# Patient Record
Sex: Female | Born: 1990 | Race: White | Hispanic: No | Marital: Single | Smoking: Never smoker
Health system: Southern US, Community
[De-identification: ages and names within clinical notes are randomized; demographics above are authoritative.]

---

## 2015-02-27 ENCOUNTER — Emergency Department (HOSPITAL_BASED_OUTPATIENT_CLINIC_OR_DEPARTMENT_OTHER): Payer: 59

## 2015-02-27 ENCOUNTER — Emergency Department (HOSPITAL_BASED_OUTPATIENT_CLINIC_OR_DEPARTMENT_OTHER)
Admission: EM | Admit: 2015-02-27 | Discharge: 2015-02-27 | Disposition: A | Discharge status: Home / Self Care | Payer: 59 | Attending: Emergency Medicine | Admitting: Emergency Medicine

## 2015-02-27 ENCOUNTER — Encounter (HOSPITAL_BASED_OUTPATIENT_CLINIC_OR_DEPARTMENT_OTHER): Payer: Self-pay

## 2015-02-27 DIAGNOSIS — S82832A Other fracture of upper and lower end of left fibula, initial encounter for closed fracture: Secondary | ICD-10-CM | POA: Insufficient documentation

## 2015-02-27 DIAGNOSIS — Y998 Other external cause status: Secondary | ICD-10-CM | POA: Diagnosis not present

## 2015-02-27 DIAGNOSIS — Y9389 Activity, other specified: Secondary | ICD-10-CM | POA: Insufficient documentation

## 2015-02-27 DIAGNOSIS — S99912A Unspecified injury of left ankle, initial encounter: Secondary | ICD-10-CM | POA: Diagnosis present

## 2015-02-27 DIAGNOSIS — Y9289 Other specified places as the place of occurrence of the external cause: Secondary | ICD-10-CM | POA: Insufficient documentation

## 2015-02-27 DIAGNOSIS — S9002XA Contusion of left ankle, initial encounter: Secondary | ICD-10-CM | POA: Diagnosis not present

## 2015-02-27 DIAGNOSIS — W1839XA Other fall on same level, initial encounter: Secondary | ICD-10-CM | POA: Diagnosis not present

## 2015-02-27 NOTE — Discharge Instructions (Signed)
Fibular Fracture, Ankle, Adult, Treated With or Without Immobilization °A fibular fracture at your ankle is a break (fracture) bone in the smallest of the two bones in your lower leg, located on the outside of your leg (fibula) close to the area at your ankle joint. °CAUSES °· Rolling your ankle. °· Twisting your ankle. °· Extreme flexing or extending of your foot. °· Severe force on your ankle as when falling from a distance. °RISK FACTORS °· Jumping activities. °· Participation in sports. °· Osteoporosis. °· Advanced age. °· Previous ankle injuries. °SIGNS AND SYMPTOMS °· Pain. °· Swelling. °· Inability to put weight on injured ankle. °· Bruising. °· Bone deformities at site of injury. °DIAGNOSIS  °This fracture is diagnosed with the help of an X-ray exam. °TREATMENT  °If the fractured bone did not move out of place it usually will heal without problems and does casting or splinting. If immobilization is needed for comfort or the fractured bone moved out of place and will not heal properly with immobilization, a cast or splint will be used. °HOME CARE INSTRUCTIONS  °· Apply ice to the area of injury: °¨ Put ice in a plastic bag. °¨ Place a towel between your skin and the bag. °¨ Leave the ice on for 20 minutes, 2-3 times a day. °· Use crutches as directed. Resume walking without crutches as directed by your health care provider. °· Only take over-the-counter or prescription medicines for pain, discomfort, or fever as directed by your health care provider. °· If you have a removable splint or boot, do not remove the boot unless directed by your health care provider. °SEEK MEDICAL CARE IF:  °· You have continued pain or more swelling °· The medications do not control the pain. °SEEK IMMEDIATE MEDICAL CARE IF: °· You develop severe pain in the leg or foot. °· Your skin or nails below the injury turn blue or grey or feel cold or numb. °MAKE SURE YOU:  °· Understand these instructions. °· Will watch your  condition. °· Will get help right away if you are not doing well or get worse. °Document Released: 06/30/2005 Document Revised: 04/20/2013 Document Reviewed: 02/09/2013 °[employer]® Patient Information ©2015 [employer]. This information is not intended to replace advice given to you by your health care provider. Make sure you discuss any questions you have with your health care provider. ° °

## 2015-02-27 NOTE — ED Provider Notes (Signed)
CSN: [id number]     Arrival date & time 02/27/15  2116 History  This chart was scribed for Benjiman Core, MD by Chestine Spore, ED Scribe. The patient was seen in room MH08/MH08 at 10:22 PM.     Chief Complaint  Patient presents with  . Ankle Pain      The history is provided by the patient. No language interpreter was used.    Tippi Mccrae is a 24 y.o. female who presents to the Emergency Department complaining of left ankle pain onset tonight PTA. Pt reports that she was playing kickball when she fell and injured herself. Pt notes that she is able to ambulate but not without pain to the ankle so she has been avoiding walking. Pt is having associated symptoms of joint swelling, and abrasions to right knee/right palm. She denies color change, wound, rash, left knee pain, and any other symptoms.    History reviewed. No pertinent past medical history. History reviewed. No pertinent past surgical history. No family history on file. Social History  Substance Use Topics  . Smoking status: Never Smoker   . Smokeless tobacco: None  . Alcohol Use: No   OB History    No data available     Review of Systems  Musculoskeletal: Positive for joint swelling and arthralgias. Negative for gait problem.  Skin: Positive for wound. Negative for color change and rash.      Allergies  Review of patient's allergies indicates no known allergies.  Home Medications   Prior to Admission medications   Not on File   BP 110/64 mmHg  Pulse 81  Temp(Src) 98.1 F (36.7 C) (Oral)  Resp 14  Ht  (1.651 m)  Wt 145 lb (65.772 kg)  BMI 24.13 kg/m2  SpO2 98%  LMP 01/30/2015 Physical Exam  Constitutional: She appears well-developed and well-nourished. No distress.  Musculoskeletal:       Left ankle: She exhibits ecchymosis. Tenderness. Lateral malleolus tenderness found.       Left lower leg: Normal.  Left ankle: no tenderness over proximal fibula. Ecchymosis and tenderness over lateral  malleolus of left ankle. NVI over foot. No tenderness over heel.  Skin: Skin is warm and dry. Abrasion noted. She is not diaphoretic.  Nursing note and vitals reviewed.   ED Course  Procedures (including critical care time) COORDINATION OF CARE: 10:28 PM Discussed treatment plan with pt at bedside and pt agreed to plan.   Labs Review Labs Reviewed - No data to display  Imaging Review Dg Ankle Complete Left  02/27/2015   CLINICAL DATA:  Collision playing kickball tonight with lateral left ankle swelling and pain  EXAM: LEFT ANKLE COMPLETE - 3+ VIEW  COMPARISON:  None.  FINDINGS: Nondisplaced fracture through the lateral malleolus. Overlying soft tissue swelling. No other fractures. There is a moderate joint effusion.  IMPRESSION: Nondisplaced fracture distal fibula with associated ankle joint effusion.   Electronically Signed   By: Esperanza Heir M.D.   On: 02/27/2015 22:04   I have personally reviewed and evaluated these images and lab results as part of my medical decision-making.   EKG Interpretation None      MDM   Final diagnoses:  Fracture of distal end of left fibula    Patient with distal fibular fracture. Given Cam Walker and will be nonweight bearing. She will follow up with sports medicine   I personally performed the services described in this documentation, which was scribed in my presence. The recorded information has  been reviewed and is accurate.     Benjiman Core, MD 02/28/15 [phone]

## 2015-02-27 NOTE — ED Notes (Signed)
Pt reports left ankle edema, pain, injury after fall while playing kickball, pt also has abrasions to right knee, right palm.

## 2015-03-05 ENCOUNTER — Encounter: Payer: Self-pay | Admitting: Family Medicine

## 2015-03-05 ENCOUNTER — Ambulatory Visit (INDEPENDENT_AMBULATORY_CARE_PROVIDER_SITE_OTHER): Payer: 59 | Admitting: Family Medicine

## 2015-03-05 VITALS — BP 112/77 | HR 85 | Ht 65.0 in | Wt 145.0 lb

## 2015-03-05 DIAGNOSIS — S99912A Unspecified injury of left ankle, initial encounter: Secondary | ICD-10-CM | POA: Diagnosis not present

## 2015-03-05 NOTE — Patient Instructions (Signed)
You have a distal fibula fracture. Expect this to take 6-8 weeks for full recovery. Follow up with me in about a week and a half for reevaluation, repeat x-rays. Wear the boot when up and walking around - ok to bear weight as tolerated. Elevate above your heart when possible. Icing 15 minutes at a time 3-4 times a day. ACE wrap for compression, swelling. Advil as you have been for pain and inflammation. Crutches as needed - wean off of these when you can bear weight comfortably in the boot. Calcium  daily, vitamin D 800 IU daily.

## 2015-03-07 DIAGNOSIS — S99912A Unspecified injury of left ankle, initial encounter: Secondary | ICD-10-CM | POA: Insufficient documentation

## 2015-03-07 NOTE — Assessment & Plan Note (Signed)
Left distal fibula fracture - below level of ankle joint, good prognostically.  Expect 6-8 weeks to fully recover.  Cam walker with crutches.  Bear weight as tolerated.  Icing, elevation, nsaids.  F/u in 2 weeks, repeat radiographs.

## 2015-03-07 NOTE — Progress Notes (Signed)
PCP: [facility]  Subjective:   HPI: Patient is a 24 y.o. female here for left ankle injury.  Patient reports on 8/16 she was playing kickball when she was 'taken out' by another player causing severe pain lateral left ankle.   Unable to bear weight. + swelling, bruising. No prior injuries. Taking advil, is icing and elevating. Wearing cam walker when up and walking around. Crutches as needed.  No past medical history on file.  No current outpatient prescriptions on file prior to visit.   No current facility-administered medications on file prior to visit.    No past surgical history on file.  No Known Allergies  Social History   Social History  . Marital Status: Single    Spouse Name: N/A  . Number of Children: N/A  . Years of Education: N/A   Occupational History  . Not on file.   Social History Main Topics  . Smoking status: Never Smoker   . Smokeless tobacco: Not on file  . Alcohol Use: No  . Drug Use: Not on file  . Sexual Activity: Not on file   Other Topics Concern  . Not on file   Social History Narrative    No family history on file.  BP 112/77 mmHg  Pulse 85  Ht  (1.651 m)  Wt 145 lb (65.772 kg)  BMI 24.13 kg/m2  LMP 01/30/2015  Review of Systems: See HPI above.    Objective:  Physical Exam:  Gen: NAD  Left ankle: Mod swelling, bruising mainly lateral ankle. Able to move in all directions - did not test full extent with known fracture TTP distal fibula, less ATFL.  No other tenderness. Deferred ant drawer and talar tilt.   Thompsons test negative. NV intact distally.    Assessment & Plan:  1. Left distal fibula fracture - below level of ankle joint, good prognostically.  Expect 6-8 weeks to fully recover.  Cam walker with crutches.  Bear weight as tolerated.  Icing, elevation, nsaids.  F/u in 2 weeks, repeat radiographs.

## 2015-03-20 ENCOUNTER — Encounter: Payer: Self-pay | Admitting: Family Medicine

## 2015-03-20 ENCOUNTER — Ambulatory Visit (HOSPITAL_BASED_OUTPATIENT_CLINIC_OR_DEPARTMENT_OTHER)
Admission: RE | Admit: 2015-03-20 | Discharge: 2015-03-20 | Disposition: A | Discharge status: Home / Self Care | Payer: 59 | Source: Ambulatory Visit | Attending: Family Medicine | Admitting: Family Medicine

## 2015-03-20 ENCOUNTER — Ambulatory Visit (INDEPENDENT_AMBULATORY_CARE_PROVIDER_SITE_OTHER): Payer: 59 | Admitting: Family Medicine

## 2015-03-20 VITALS — BP 112/76 | HR 71 | Ht 64.0 in | Wt 145.0 lb

## 2015-03-20 DIAGNOSIS — S99912A Unspecified injury of left ankle, initial encounter: Secondary | ICD-10-CM

## 2015-03-20 DIAGNOSIS — S82422D Displaced transverse fracture of shaft of left fibula, subsequent encounter for closed fracture with routine healing: Secondary | ICD-10-CM | POA: Insufficient documentation

## 2015-03-20 DIAGNOSIS — X58XXXD Exposure to other specified factors, subsequent encounter: Secondary | ICD-10-CM | POA: Insufficient documentation

## 2015-03-20 DIAGNOSIS — S82832D Other fracture of upper and lower end of left fibula, subsequent encounter for closed fracture with routine healing: Secondary | ICD-10-CM | POA: Diagnosis present

## 2015-03-20 NOTE — Patient Instructions (Signed)
You have a distal fibula fracture. Expect this to take 6-8 weeks total for full recovery. Follow up with me in 2 weeks Wear the boot when up and walking around - ok to bear weight as tolerated. Elevate above your heart when possible. Icing 15 minutes at a time 3-4 times a day. ACE wrap for compression, swelling. Advil as you have been for pain and inflammation. Calcium  daily, vitamin D 800 IU daily.

## 2015-03-26 NOTE — Assessment & Plan Note (Signed)
below level of ankle joint, good prognostically.  Repeat radiographs do not show displacement of the fragment.  Continue cam walker, discontinue crutches.  Bear weight as tolerated.  Icing, nsaids if needed.  F/u in 2 weeks.  Consider repeat radiographs at that time as well until we see some callus formation.

## 2015-03-26 NOTE — Progress Notes (Signed)
PCP: [facility]  Subjective:   HPI: Patient is a 24 y.o. female here for left ankle injury.  8/22: Patient reports on 8/16 she was playing kickball when she was 'taken out' by another player causing severe pain lateral left ankle.   Unable to bear weight. + swelling, bruising. No prior injuries. Taking advil, is icing and elevating. Wearing cam walker when up and walking around. Crutches as needed.  9/6: Patient reports she feels about 50% improved. Able to put some pressure on foot. Not using crutches since this weekend. Not taking medication for pain. Some swelling.  No past medical history on file.  No current outpatient prescriptions on file prior to visit.   No current facility-administered medications on file prior to visit.    No past surgical history on file.  No Known Allergies  Social History   Social History  . Marital Status: Single    Spouse Name: N/A  . Number of Children: N/A  . Years of Education: N/A   Occupational History  . Not on file.   Social History Main Topics  . Smoking status: Never Smoker   . Smokeless tobacco: Not on file  . Alcohol Use: No  . Drug Use: Not on file  . Sexual Activity: Not on file   Other Topics Concern  . Not on file   Social History Narrative    No family history on file.  BP 112/76 mmHg  Pulse 71  Ht  (1.626 m)  Wt 145 lb (65.772 kg)  BMI 24.88 kg/m2  LMP 03/20/2015  Review of Systems: See HPI above.    Objective:  Physical Exam:  Gen: NAD  Left ankle: Mod swelling, no bruising mainly lateral ankle. Able to move in all directions - did not test full extent with known fracture TTP distal fibula, less ATFL.  No other tenderness. Deferred ant drawer and talar tilt.   Thompsons test negative. NV intact distally.    Assessment & Plan:  1. Left distal fibula fracture - below level of ankle joint, good prognostically.  Repeat radiographs do not show displacement of the fragment.   Continue cam walker, discontinue crutches.  Bear weight as tolerated.  Icing, nsaids if needed.  F/u in 2 weeks.  Consider repeat radiographs at that time as well until we see some callus formation.

## 2015-04-06 ENCOUNTER — Ambulatory Visit: Payer: 59 | Admitting: Family Medicine

## 2015-04-06 ENCOUNTER — Ambulatory Visit (INDEPENDENT_AMBULATORY_CARE_PROVIDER_SITE_OTHER): Payer: 59 | Admitting: Family Medicine

## 2015-04-06 ENCOUNTER — Encounter: Payer: Self-pay | Admitting: Family Medicine

## 2015-04-06 ENCOUNTER — Encounter (INDEPENDENT_AMBULATORY_CARE_PROVIDER_SITE_OTHER): Payer: Self-pay

## 2015-04-06 VITALS — BP 119/80 | HR 69 | Ht 65.0 in | Wt 145.0 lb

## 2015-04-06 DIAGNOSIS — S99912D Unspecified injury of left ankle, subsequent encounter: Secondary | ICD-10-CM | POA: Diagnosis not present

## 2015-04-06 NOTE — Patient Instructions (Signed)
You are nearly completely clinically healed at this point. Icing, tylenol/ibuprofen only if needed. ACE wrap, elevation for swelling. Wear the boot when up and walking around. Try to go a couple hours without this at work on Wednesday, increase to about 4 hours Thursday, 6 hours Friday then out of it completely by Saturday. If you get pain, start wearing it again and use it until you see me back. Do up/downs and alphabet exercises twice a day. Follow up with me in 2 weeks. No running in meantime.

## 2015-04-11 NOTE — Progress Notes (Signed)
PCP: [facility]  Subjective:   HPI: Patient is a 24 y.o. female here for left ankle injury.  8/22: Patient reports on 8/16 she was playing kickball when she was 'taken out' by another player causing severe pain lateral left ankle.   Unable to bear weight. + swelling, bruising. No prior injuries. Taking advil, is icing and elevating. Wearing cam walker when up and walking around. Crutches as needed.  9/6: Patient reports she feels about 50% improved. Able to put some pressure on foot. Not using crutches since this weekend. Not taking medication for pain. Some swelling.  9/23: Patient reports she feels much better. No pain currently. Slight swelling if on foot for a while. Able to walk in cam boot without issues. Not taking any medicine for pain.  No past medical history on file.  No current outpatient prescriptions on file prior to visit.   No current facility-administered medications on file prior to visit.    No past surgical history on file.  No Known Allergies  Social History   Social History  . Marital Status: Single    Spouse Name: N/A  . Number of Children: N/A  . Years of Education: N/A   Occupational History  . Not on file.   Social History Main Topics  . Smoking status: Never Smoker   . Smokeless tobacco: Not on file  . Alcohol Use: No  . Drug Use: Not on file  . Sexual Activity: Not on file   Other Topics Concern  . Not on file   Social History Narrative    No family history on file.  BP 119/80 mmHg  Pulse 69  Ht  (1.651 m)  Wt 145 lb (65.772 kg)  BMI 24.13 kg/m2  LMP 03/20/2015  Review of Systems: See HPI above.    Objective:  Physical Exam:  Gen: NAD  Left ankle: Mild swelling, no bruising mainly lateral ankle. Able to move in all directions. No TTP distal fibula, ATFL.  No other tenderness. Deferred ant drawer and talar tilt.   Thompsons test negative. NV intact distally.    Assessment & Plan:  1.  Left distal fibula fracture - below level of ankle joint, good prognostically.  Repeat radiographs did not show displacement of the fragment and clinically she is significantly improved.  Discussed repeat radiographs would not change management - will continue cam walker for a few more days then transition out of this if tolerated.  No running until I see her back in 2 weeks.  Icing, tylenol if needed.

## 2015-04-11 NOTE — Assessment & Plan Note (Signed)
below level of ankle joint, good prognostically.  Repeat radiographs did not show displacement of the fragment and clinically she is significantly improved.  Discussed repeat radiographs would not change management - will continue cam walker for a few more days then transition out of this if tolerated.  No running until I see her back in 2 weeks.  Icing, tylenol if needed.

## 2015-04-12 ENCOUNTER — Ambulatory Visit: Payer: 59 | Admitting: Family Medicine

## 2015-04-27 ENCOUNTER — Encounter (INDEPENDENT_AMBULATORY_CARE_PROVIDER_SITE_OTHER): Payer: Self-pay

## 2015-04-27 ENCOUNTER — Ambulatory Visit (INDEPENDENT_AMBULATORY_CARE_PROVIDER_SITE_OTHER): Payer: 59 | Admitting: Family Medicine

## 2015-04-27 ENCOUNTER — Encounter: Payer: Self-pay | Admitting: Family Medicine

## 2015-04-27 VITALS — BP 116/79 | HR 65 | Ht 65.0 in | Wt 145.0 lb

## 2015-04-27 DIAGNOSIS — S99912D Unspecified injury of left ankle, subsequent encounter: Secondary | ICD-10-CM

## 2015-04-27 NOTE — Assessment & Plan Note (Signed)
clinically healed at this point.  Out of cam walker without problems.  Advised can restart running, other activities now - just call us if she has any problems.  F/u prn.

## 2015-04-27 NOTE — Progress Notes (Signed)
PCP: [facility]  Subjective:   HPI: Patient is a 24 y.o. female here for left ankle injury.  8/22: Patient reports on 8/16 she was playing kickball when she was 'taken out' by another player causing severe pain lateral left ankle.   Unable to bear weight. + swelling, bruising. No prior injuries. Taking advil, is icing and elevating. Wearing cam walker when up and walking around. Crutches as needed.  9/6: Patient reports she feels about 50% improved. Able to put some pressure on foot. Not using crutches since this weekend. Not taking medication for pain. Some swelling.  9/23: Patient reports she feels much better. No pain currently. Slight swelling if on foot for a while. Able to walk in cam boot without issues. Not taking any medicine for pain.  10/14: Patient is doing well. Denies pain - 0/10 level currently. No longer wearing boot - did [place] [employer] [place] [employer]. No swelling, bruising. Not taking any medicines for this. No skin changes, fever, other complaints.  No past medical history on file.  No current outpatient prescriptions on file prior to visit.   No current facility-administered medications on file prior to visit.    No past surgical history on file.  No Known Allergies  Social History   Social History  . Marital Status: Single    Spouse Name: N/A  . Number of Children: N/A  . Years of Education: N/A   Occupational History  . Not on file.   Social History Main Topics  . Smoking status: Never Smoker   . Smokeless tobacco: Not on file  . Alcohol Use: No  . Drug Use: Not on file  . Sexual Activity: Not on file   Other Topics Concern  . Not on file   Social History Narrative    No family history on file.  BP 116/79 mmHg  Pulse 65  Ht 5\' 5"  (1.651 m)  Wt 145 lb (65.772 kg)  BMI 24.13 kg/m2  Review of Systems: See HPI above.    Objective:  Physical Exam:  Gen: NAD  Left ankle: No swelling,  bruising. FROM without pain.  5/5 strength. No TTP distal fibula, ATFL.  No other tenderness. Negative ant drawer and talar tilt.   Thompsons test negative. NV intact distally.  Right ankle: FROM without pain.    Assessment & Plan:  1. Left distal fibula fracture - clinically healed at this point.  Out of cam walker without problems.  Advised can restart running, other activities now - just call us if she has any problems.  F/u prn.

## 2017-06-25 IMAGING — DX DG ANKLE COMPLETE 3+V*L*
3 series · 3 of 3 positions shown · non-contrast
Comparison: None.

CLINICAL DATA: Collision playing kickball tonight with lateral left
ankle swelling and pain

EXAM:
LEFT ANKLE COMPLETE - 3+ VIEW

[ankle ap]
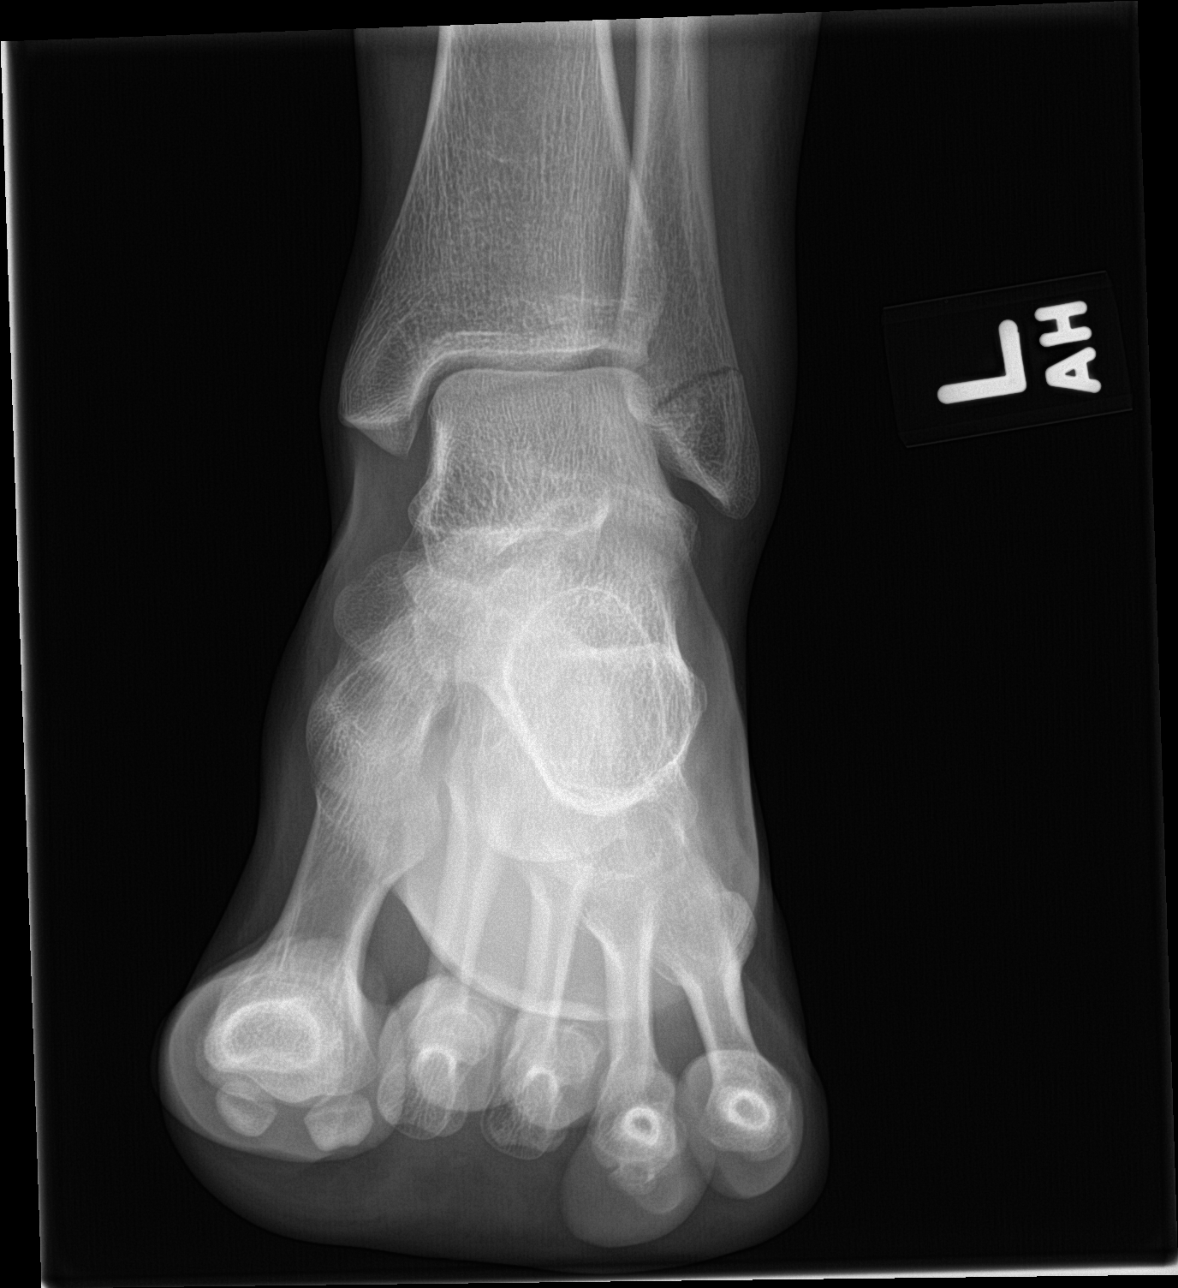

[ankle obl]
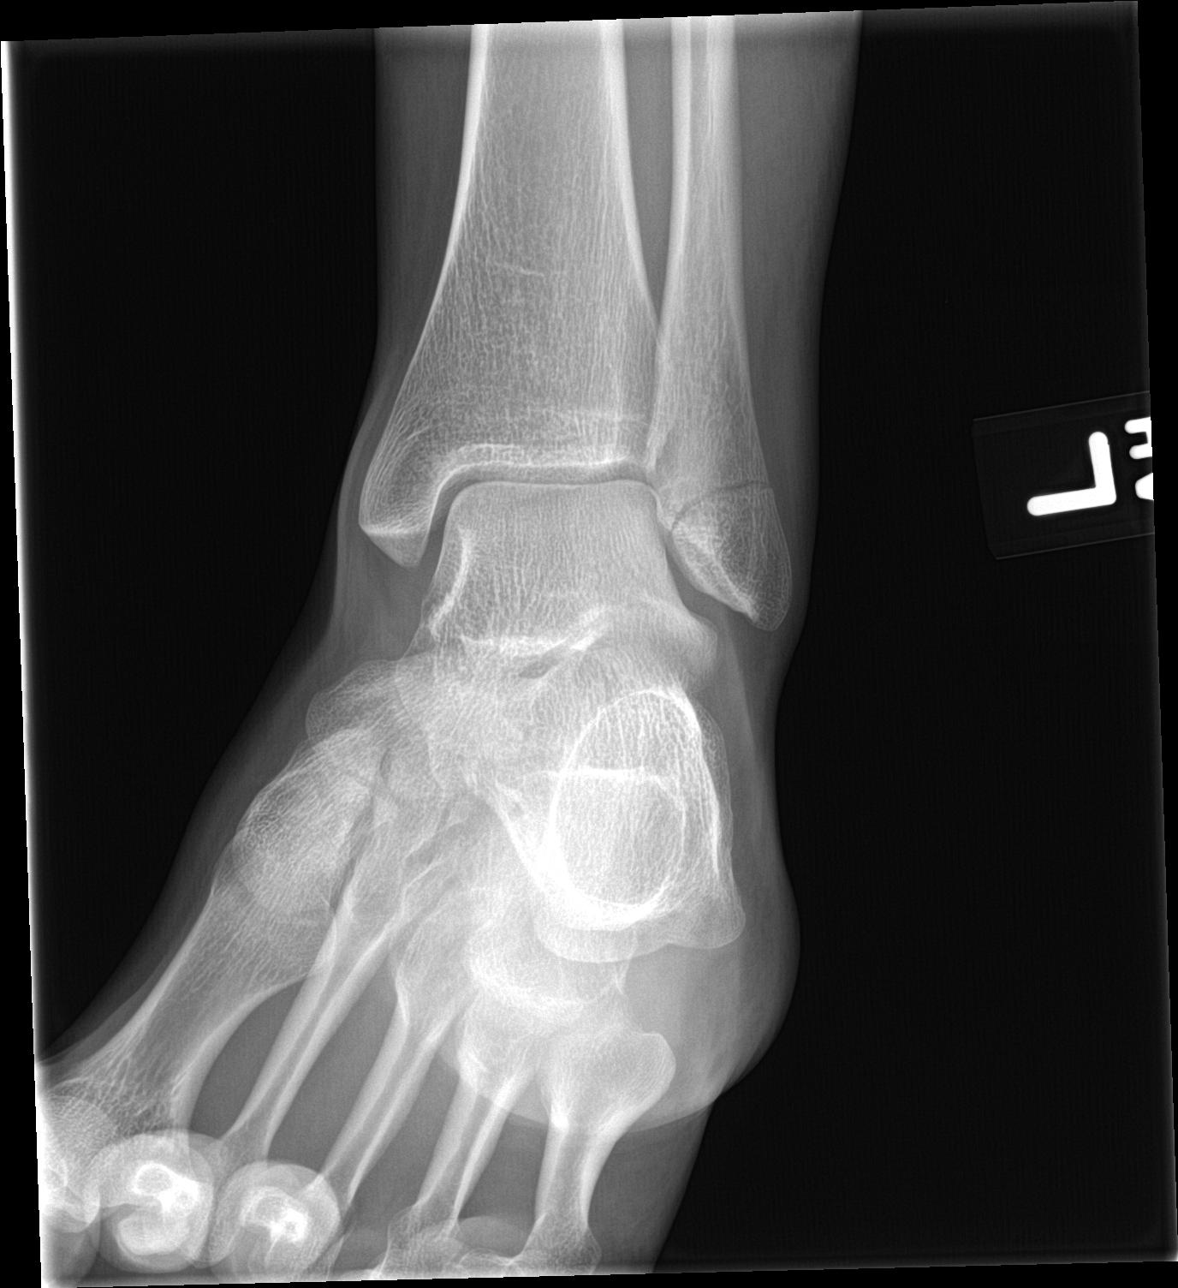

[ankle lat]
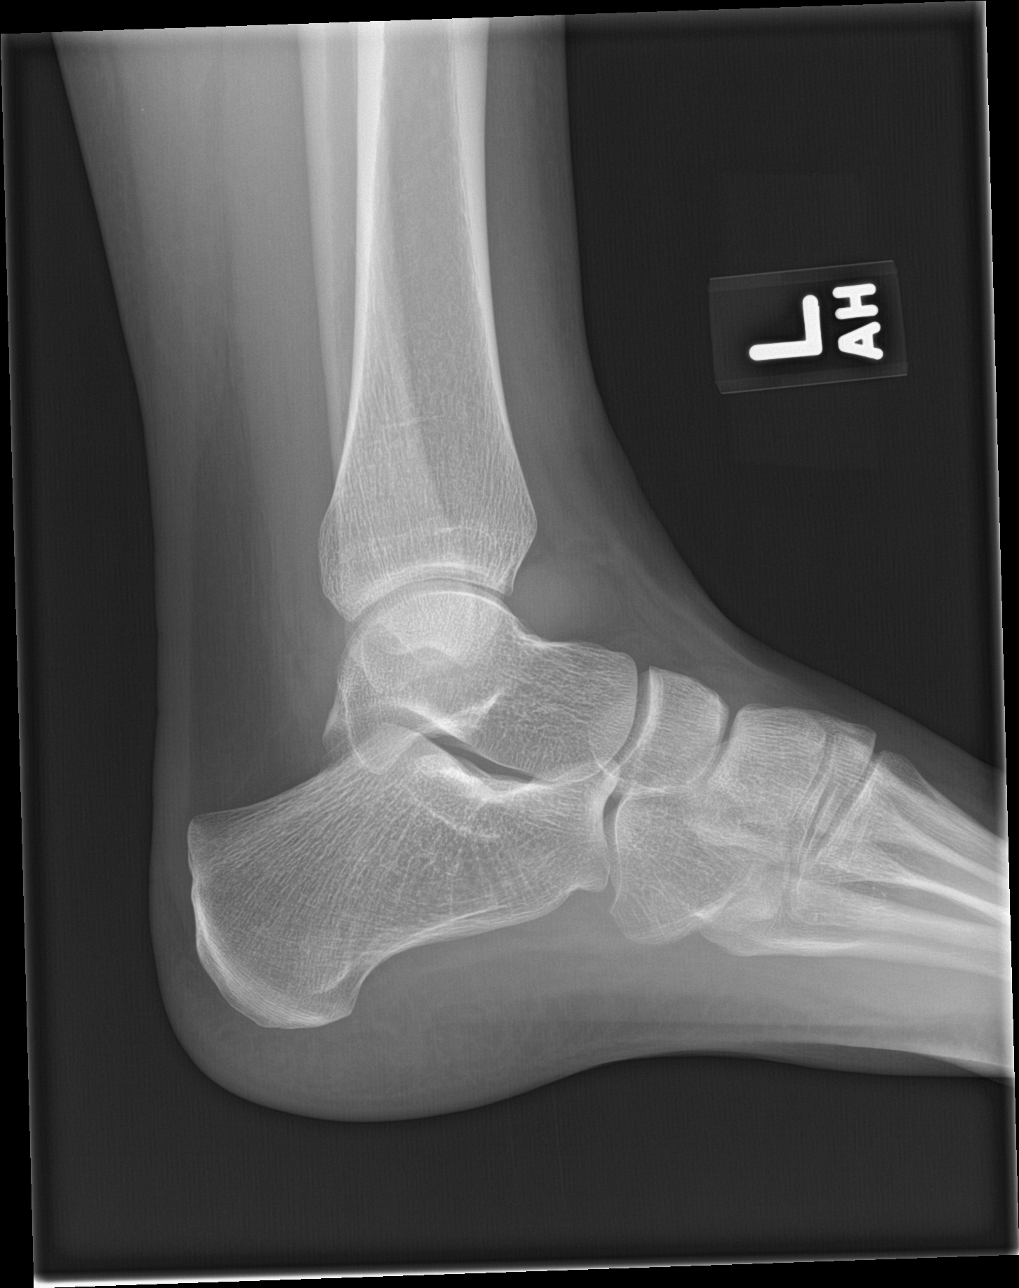

[3 of 3 positions shown; findings below may reference images not displayed]

FINDINGS: Nondisplaced fracture through the lateral malleolus. Overlying soft
tissue swelling. No other fractures. There is a moderate joint
effusion.
IMPRESSION: Nondisplaced fracture distal fibula with associated ankle joint
effusion.

## 2017-07-16 IMAGING — DX DG ANKLE COMPLETE 3+V*L*
3 series · 3 of 3 positions shown · non-contrast
Comparison: Left ankle films of 02/27/2015

CLINICAL DATA: Followup of distal fibular fracture

EXAM:
LEFT ANKLE COMPLETE - 3+ VIEW

[ankle ap]
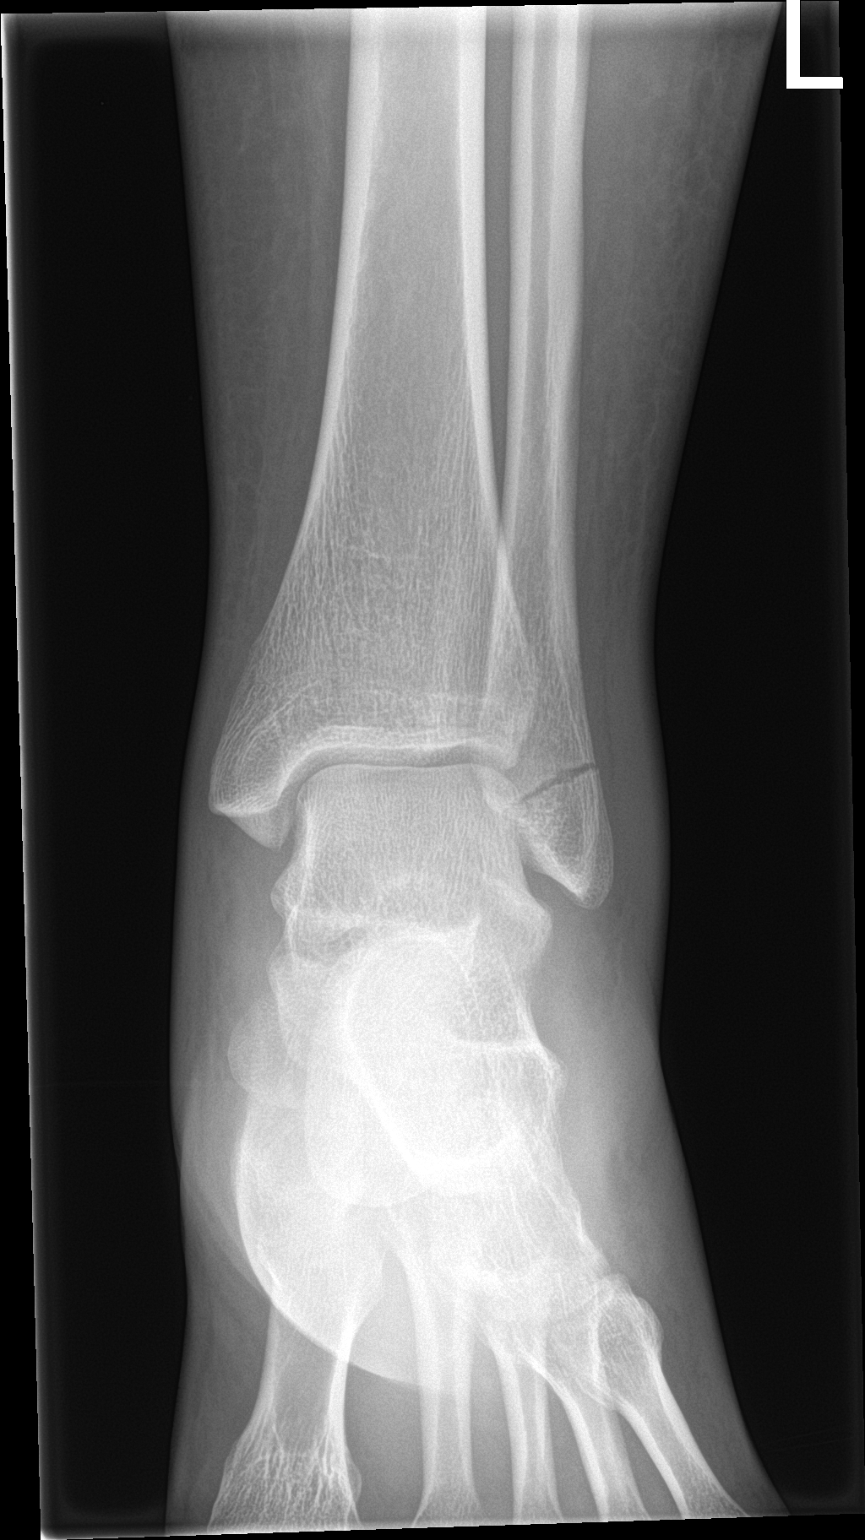

[ankle obl]
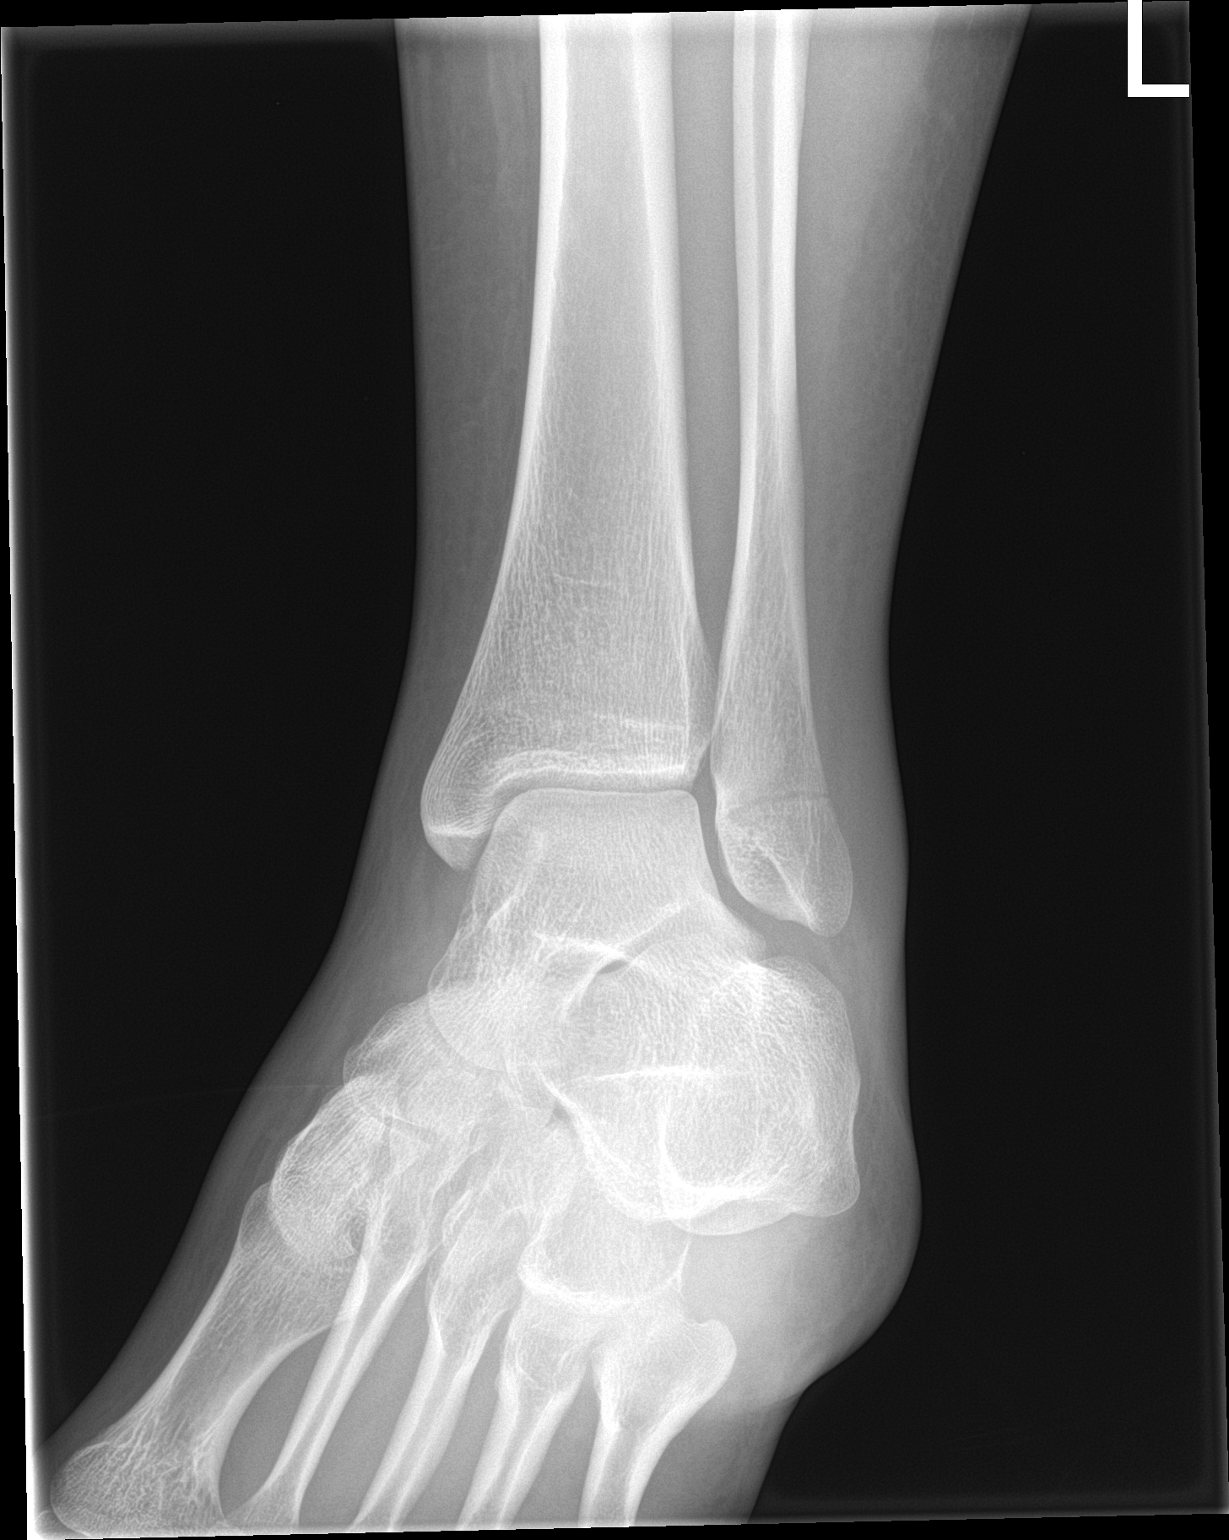

[ankle lat]
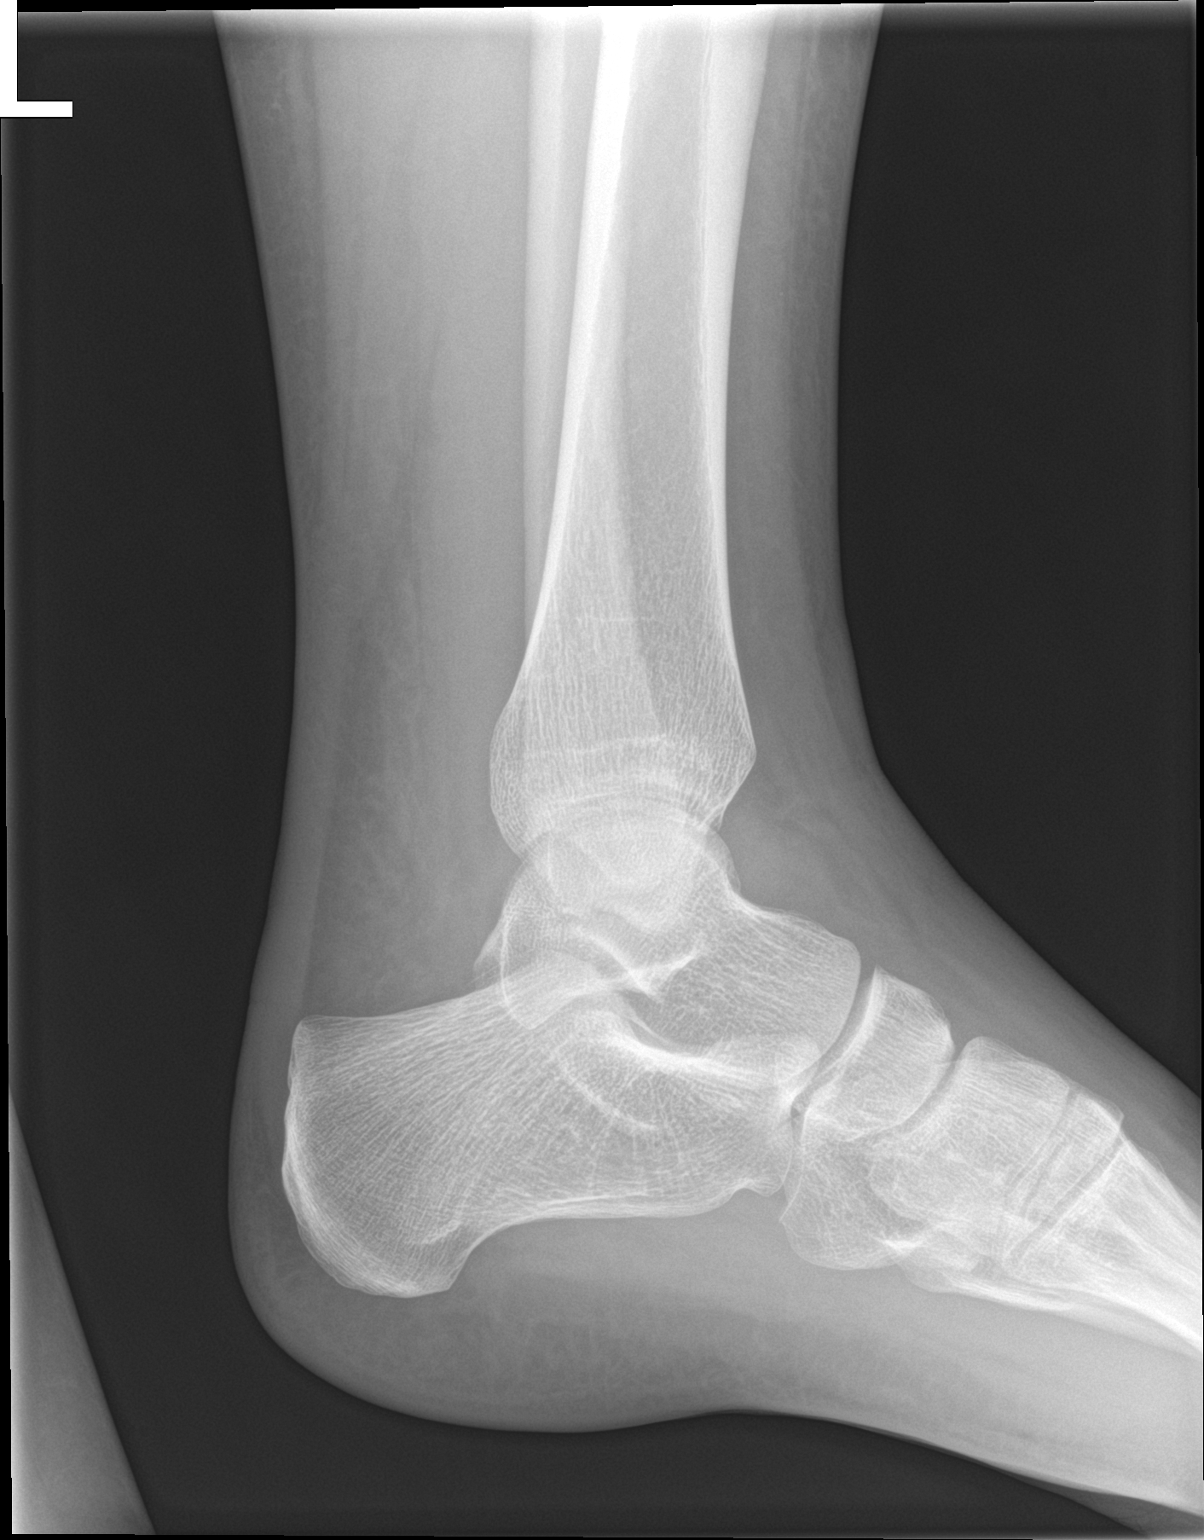

[3 of 3 positions shown; findings below may reference images not displayed]

FINDINGS: There has been no change in position of the nondisplaced transverse
distal left fibular fracture. The ankle joint appears normal.
Alignment is normal.
IMPRESSION: No significant change in transverse distal left fibular fracture.
